# Patient Record
Sex: Female | Born: 1970
Health system: Southern US, Community
[De-identification: ages and names within clinical notes are randomized; demographics above are authoritative.]

## PROBLEM LIST (undated history)

## (undated) DIAGNOSIS — M1990 Unspecified osteoarthritis, unspecified site: Secondary | ICD-10-CM

## (undated) HISTORY — DX: Unspecified osteoarthritis, unspecified site: M19.90

---

## 1998-12-07 ENCOUNTER — Other Ambulatory Visit: Admission: RE | Admit: 1998-12-07 | Discharge: 1998-12-07 | Payer: Self-pay | Admitting: Obstetrics and Gynecology

## 2000-01-10 ENCOUNTER — Other Ambulatory Visit: Admission: RE | Admit: 2000-01-10 | Discharge: 2000-01-10 | Payer: Self-pay | Admitting: Obstetrics and Gynecology

## 2001-02-03 ENCOUNTER — Other Ambulatory Visit: Admission: RE | Admit: 2001-02-03 | Discharge: 2001-02-03 | Payer: Self-pay | Admitting: Obstetrics and Gynecology

## 2002-09-13 ENCOUNTER — Other Ambulatory Visit: Admission: RE | Admit: 2002-09-13 | Discharge: 2002-09-13 | Payer: Self-pay | Admitting: Obstetrics and Gynecology

## 2003-10-17 ENCOUNTER — Other Ambulatory Visit: Admission: RE | Admit: 2003-10-17 | Discharge: 2003-10-17 | Payer: Self-pay | Admitting: Obstetrics and Gynecology

## 2004-10-23 ENCOUNTER — Other Ambulatory Visit: Admission: RE | Admit: 2004-10-23 | Discharge: 2004-10-23 | Payer: Self-pay | Admitting: Obstetrics and Gynecology

## 2005-10-15 ENCOUNTER — Encounter (INDEPENDENT_AMBULATORY_CARE_PROVIDER_SITE_OTHER): Payer: Self-pay | Admitting: Specialist

## 2005-10-15 ENCOUNTER — Encounter: Admission: RE | Admit: 2005-10-15 | Discharge: 2005-10-15 | Payer: Self-pay | Admitting: Obstetrics and Gynecology

## 2008-01-26 ENCOUNTER — Ambulatory Visit: Payer: Self-pay | Admitting: Hematology and Oncology

## 2014-07-17 ENCOUNTER — Other Ambulatory Visit: Payer: Self-pay | Admitting: Obstetrics and Gynecology

## 2014-07-17 DIAGNOSIS — N6320 Unspecified lump in the left breast, unspecified quadrant: Secondary | ICD-10-CM

## 2014-07-19 ENCOUNTER — Other Ambulatory Visit: Payer: Self-pay | Admitting: Obstetrics and Gynecology

## 2014-07-19 ENCOUNTER — Other Ambulatory Visit: Payer: Self-pay

## 2014-07-19 DIAGNOSIS — R928 Other abnormal and inconclusive findings on diagnostic imaging of breast: Secondary | ICD-10-CM

## 2014-07-20 ENCOUNTER — Other Ambulatory Visit: Payer: Self-pay | Admitting: Obstetrics and Gynecology

## 2014-07-20 DIAGNOSIS — N6320 Unspecified lump in the left breast, unspecified quadrant: Secondary | ICD-10-CM

## 2014-07-24 ENCOUNTER — Ambulatory Visit
Admission: RE | Admit: 2014-07-24 | Discharge: 2014-07-24 | Disposition: A | Discharge status: Home / Self Care | Payer: BLUE CROSS/BLUE SHIELD | Source: Ambulatory Visit | Attending: Obstetrics and Gynecology | Admitting: Obstetrics and Gynecology

## 2014-07-24 DIAGNOSIS — N6320 Unspecified lump in the left breast, unspecified quadrant: Secondary | ICD-10-CM

## 2016-08-28 ENCOUNTER — Other Ambulatory Visit: Payer: Self-pay | Admitting: Obstetrics and Gynecology

## 2016-08-28 DIAGNOSIS — R928 Other abnormal and inconclusive findings on diagnostic imaging of breast: Secondary | ICD-10-CM

## 2016-09-03 ENCOUNTER — Ambulatory Visit
Admission: RE | Admit: 2016-09-03 | Discharge: 2016-09-03 | Disposition: A | Discharge status: Home / Self Care | Payer: BLUE CROSS/BLUE SHIELD | Source: Ambulatory Visit | Attending: Obstetrics and Gynecology | Admitting: Obstetrics and Gynecology

## 2016-09-03 DIAGNOSIS — R928 Other abnormal and inconclusive findings on diagnostic imaging of breast: Secondary | ICD-10-CM

## 2017-06-23 HISTORY — PX: POLYPECTOMY: SHX149

## 2017-08-25 ENCOUNTER — Other Ambulatory Visit: Payer: Self-pay | Admitting: Obstetrics and Gynecology

## 2017-08-25 DIAGNOSIS — N6310 Unspecified lump in the right breast, unspecified quadrant: Secondary | ICD-10-CM

## 2017-09-04 ENCOUNTER — Ambulatory Visit
Admission: RE | Admit: 2017-09-04 | Discharge: 2017-09-04 | Disposition: A | Discharge status: Home / Self Care | Payer: BLUE CROSS/BLUE SHIELD | Source: Ambulatory Visit | Attending: Obstetrics and Gynecology | Admitting: Obstetrics and Gynecology

## 2017-09-04 DIAGNOSIS — N6310 Unspecified lump in the right breast, unspecified quadrant: Secondary | ICD-10-CM

## 2018-02-22 IMAGING — MG 2D DIGITAL DIAGNOSTIC BILATERAL MAMMOGRAM WITH CAD AND ADJUNCT T
6 series · 6 of 14 positions shown · non-contrast
Comparison: Previous exam(s).

CLINICAL DATA: Patient presents for additional views of both
breasts as followup to a recent diagnostic exam to evaluate a
possible left breast asymmetry and right breast mass.

EXAM:
2D DIGITAL DIAGNOSTIC bilateral MAMMOGRAM WITH CAD AND ADJUNCT TOMO
ULTRASOUND right BREAST

[R MLO]
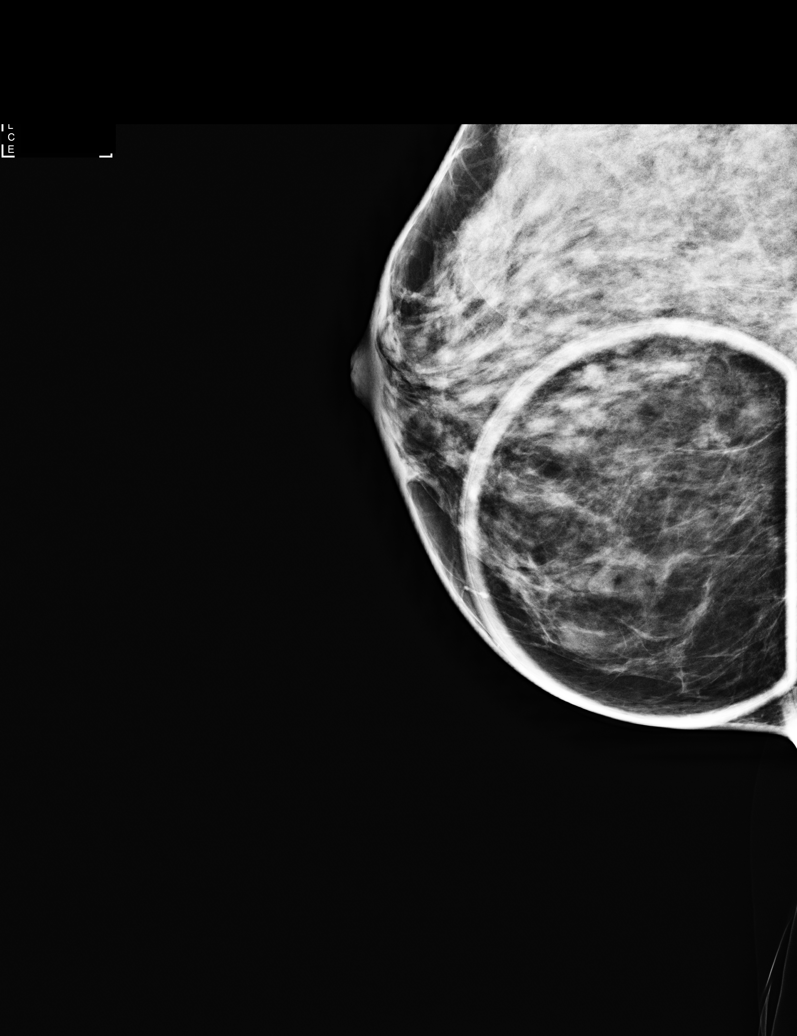

[L ML]
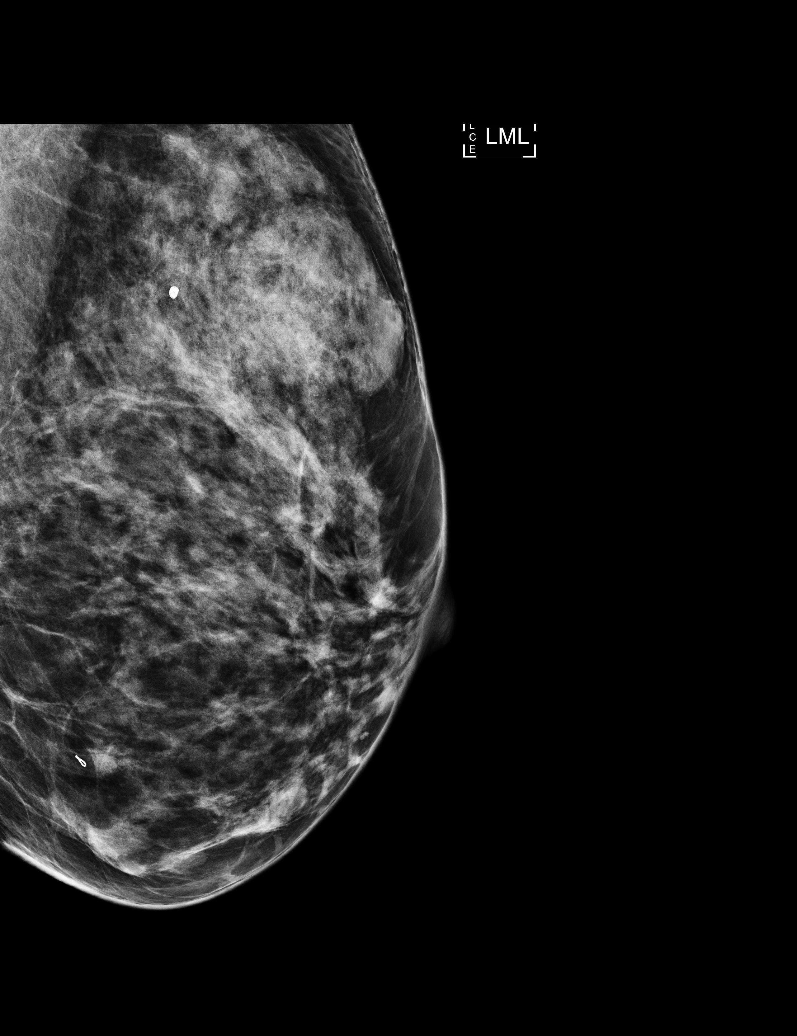

[L CC]
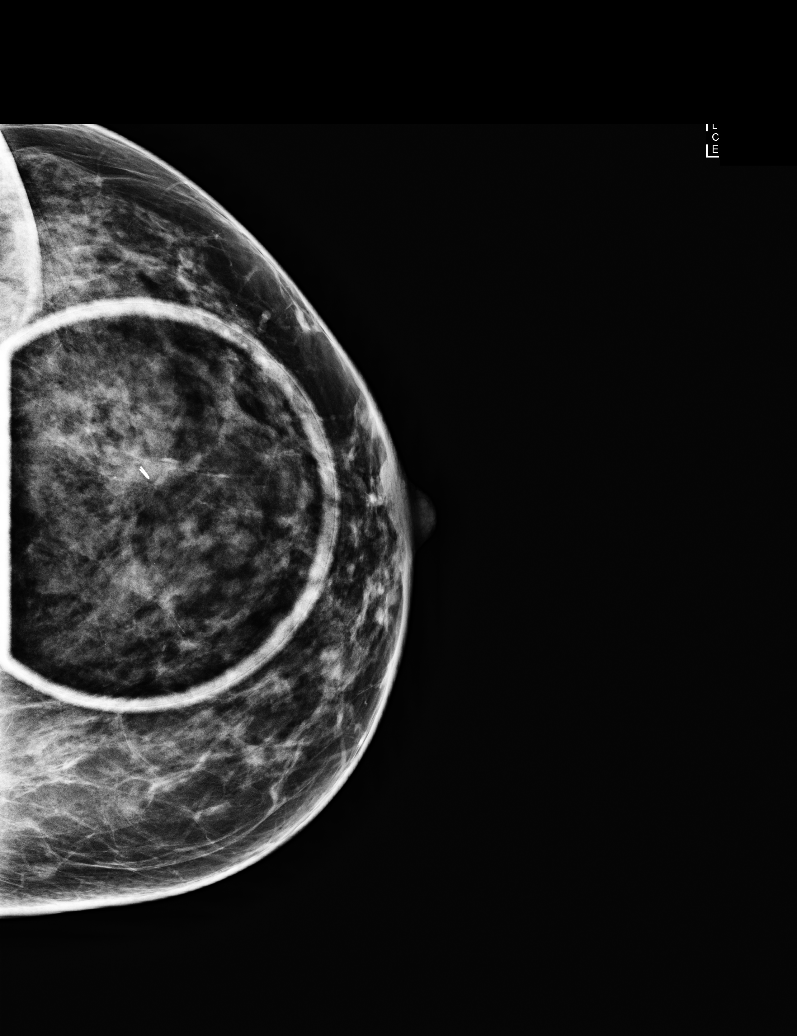

[R CC]
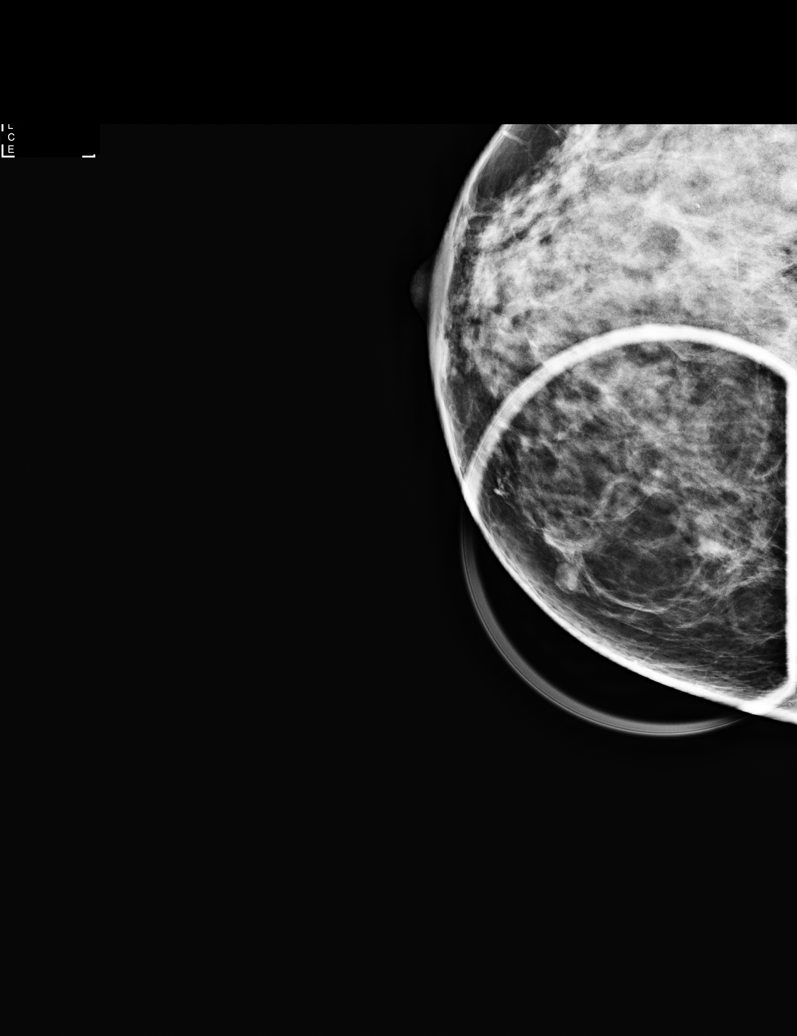

[R CC tomo · tomo slice 30/59.0]
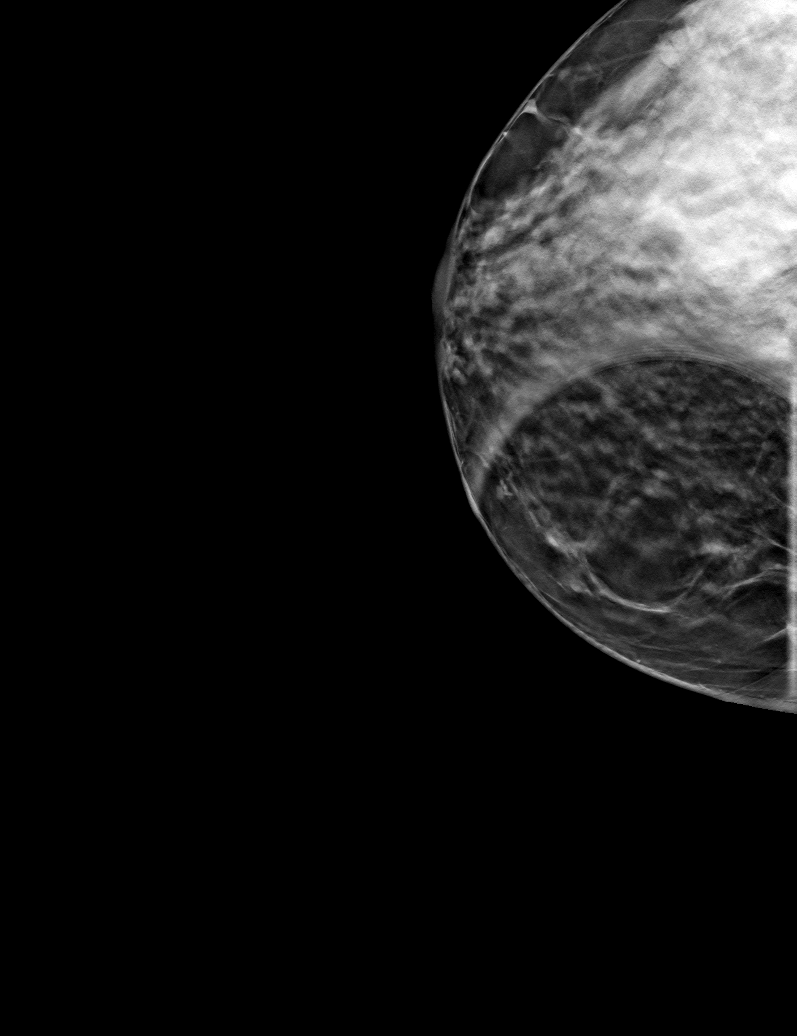

[R MLO tomo · tomo slice 28/55.0]
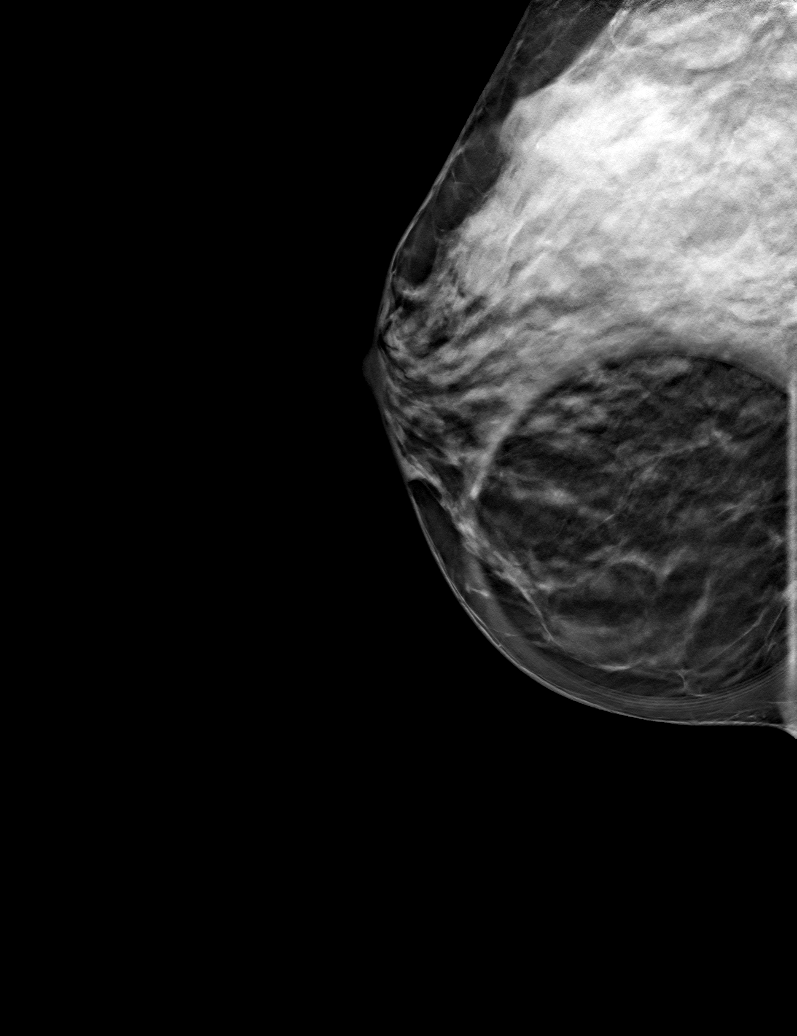

[6 of 14 positions shown; findings below may reference images not displayed]

ACR Breast Density Category c: The breast tissue is heterogeneously
dense, which may obscure small masses.
FINDINGS: Additional spot compression CC and true lateral tomographic images
of the left breast demonstrate no focal abnormality over the
slightly inner breast.

Additional images of the right breast demonstrate a 6 mm oval
circumscribed mass over the inner midportion of the right breast.

Mammographic images were processed with CAD.

Targeted ultrasound is performed, showing an oval circumscribed
hypoechoic superficial mass over the 3 o'clock position of the right
breast 3 cm from the nipple corresponding to the mammographic
abnormality. This has mild increased through transmission and a few
subtle thin septations. This likely represents a minimally
complicated cyst versus clustered microcysts and measures 4 x 5 x 6
mm.
IMPRESSION: No focal abnormality over the inner left breast.

Probable benign mass over the 3 o'clock position of the right breast
3 cm from the nipple measuring 4 x 5 x 6 mm.

RECOMMENDATION:
Recommend a followup diagnostic right breast mammogram and
ultrasound in 6 months to document stability of this probable benign
finding.

I have discussed the findings and recommendations with the patient.
Results were also provided in writing at the conclusion of the
visit. If applicable, a reminder letter will be sent to the patient
regarding the next appointment.

BI-RADS CATEGORY  3: Probably benign.

## 2019-02-23 IMAGING — US ULTRASOUND RIGHT BREAST LIMITED
1 series · 12 of 14 positions shown · non-contrast
Comparison: Mammography 09/03/2016, 07/24/2014 and earlier.

CLINICAL DATA: One year interval follow-up of likely benign complex
cyst or apocrine metaplasia involving the inner right breast at
anterior to middle depth. Annual evaluation, left breast.

EXAM:
DIGITAL DIAGNOSTIC BILATERAL MAMMOGRAM WITH CAD AND TOMO
ULTRASOUND RIGHT BREAST

[Series 1: ultrasound right breast limited · 0.05mm/px · 12 of 14 slices shown]
[im 1/14]
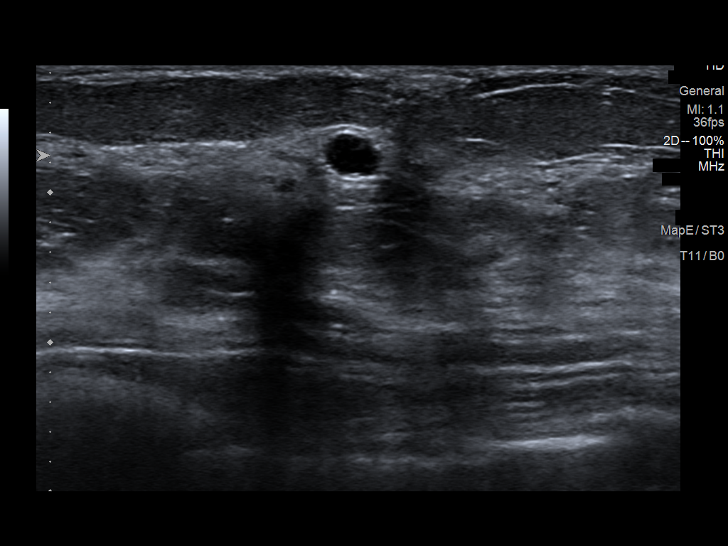
[im 2/14]
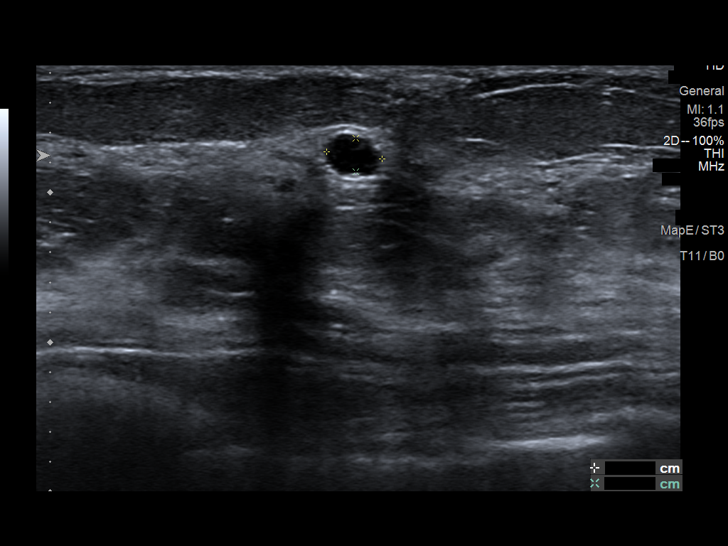
[im 3/14]
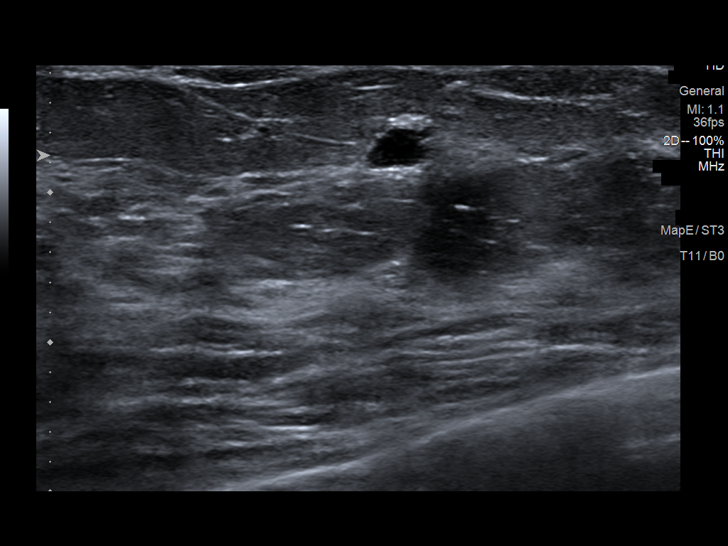
[im 5/14]
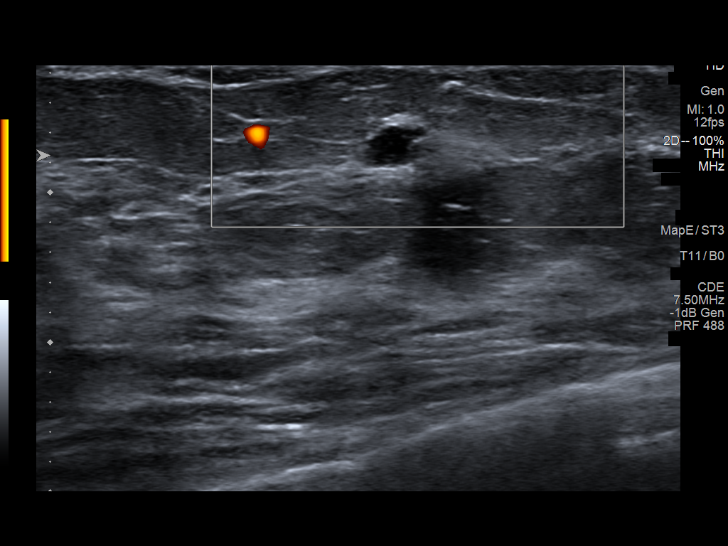
[im 6/14]
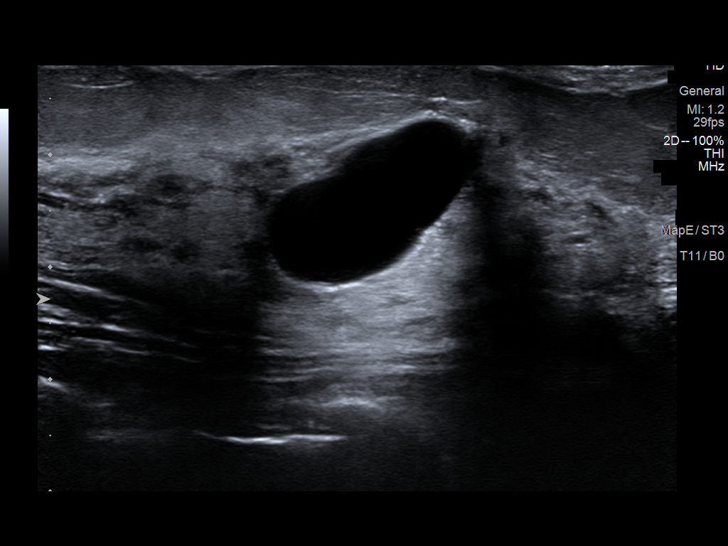
[im 7/14]
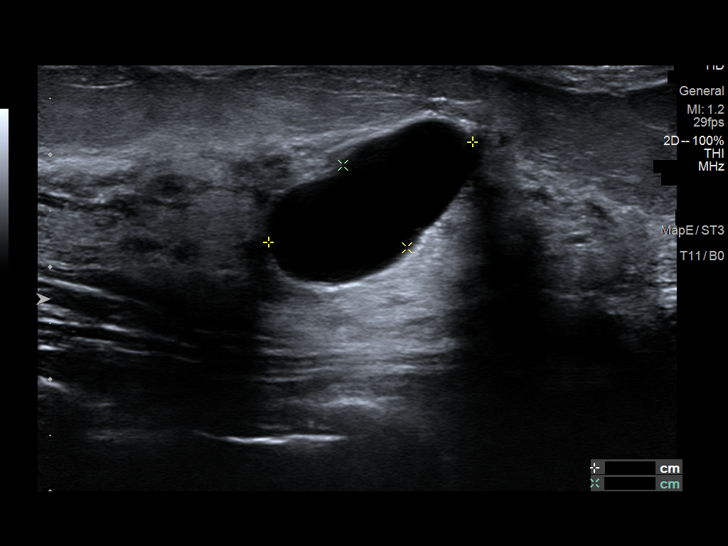
[im 8/14]
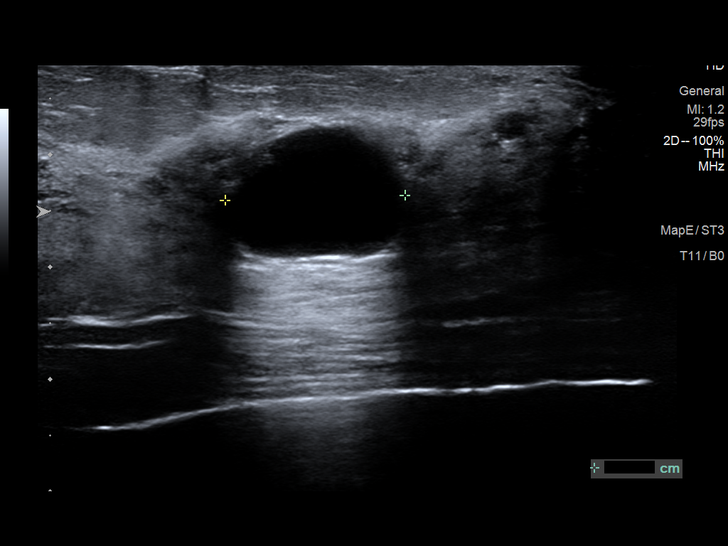
[im 9/14]
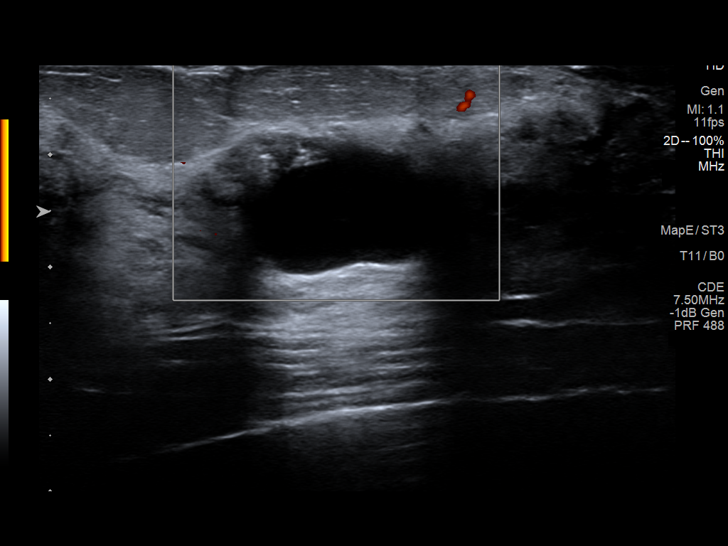
[im 10/14]
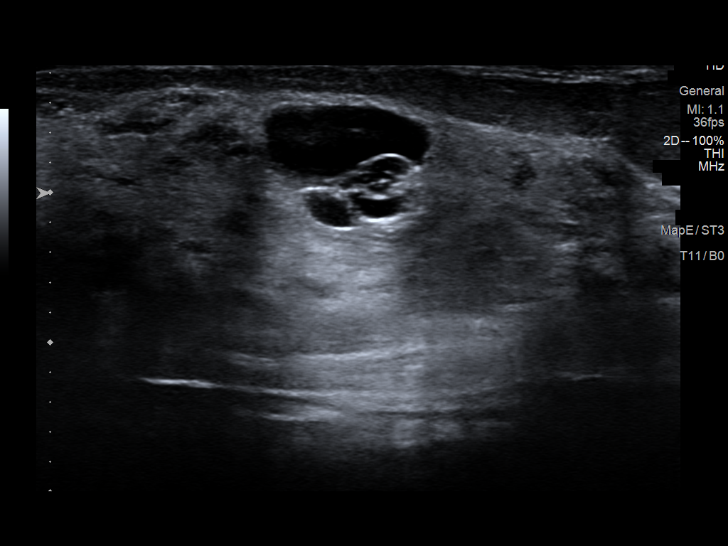
[im 12/14]
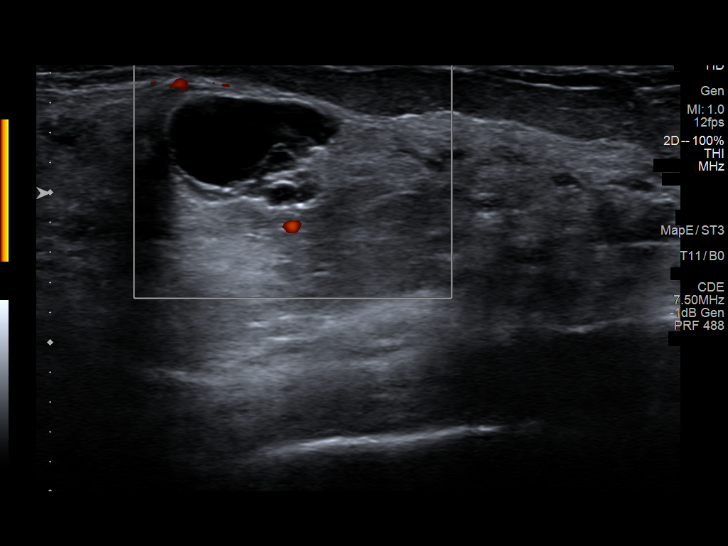
[im 13/14]
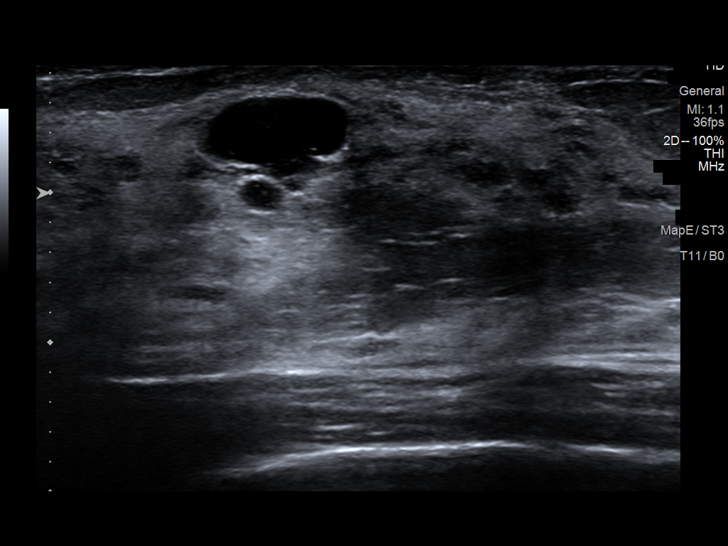
[im 14/14]
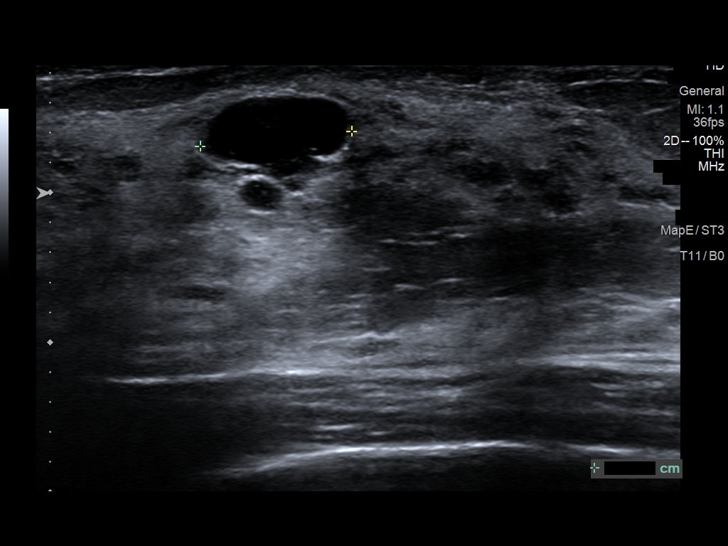

[12 of 14 positions shown; findings below may reference images not displayed]

Right
breast ultrasound 09/03/2016. Right breast ultrasound 07/24/2014 was
performed in a different part of the breast.

ACR Breast Density Category c: The breast tissue is heterogeneously
dense, which may obscure small masses.
FINDINGS: Tomosynthesis and synthesized full field CC and MLO views of both
breasts were obtained.

The circumscribed low-density mass in the inner right breast at
anterior to middle depth is slightly decreased in size since the
prior mammogram. In the interval, 2 circumscribed low-density masses
have increased in size in the outer right breast, 1 at anterior
depth and the other at posterior depth. None of the masses are
associated with architectural distortion or suspicious
calcifications. No suspicious findings elsewhere in the right
breast.

No findings suspicious for malignancy in the left breast.

Mammographic images were processed with CAD.

Targeted right breast ultrasound is performed, showing that the
previously identified circumscribed oval parallel nearly anechoic
mass at the 3 o'clock position approximately 3 cm from the nipple
has decreased in size, now measuring approximately 4 x 2 x 4 mm
(previously 5 x 4 x 6 mm).

At the 9 o'clock position approximately 1 cm from the nipple is a
circumscribed oval parallel anechoic mass measuring approximately
1.6 x 0.9 x 2.0 cm, demonstrating posterior acoustic enhancement and
no internal power Doppler flow, corresponding to the anterior
mammographic finding. At the 9 o'clock position approximately 5 cm
from the nipple is an oval circumscribed parallel mass containing
multiple cysts with associated thin internal septations measuring
approximately 1.0 x 0.8 x 1.0 cm, demonstrating posterior acoustic
enhancement and no internal power Doppler flow, corresponding to the
more posterior mammographic finding.

No suspicious solid mass or abnormal acoustic shadowing is
identified.
IMPRESSION: 1. Interval decrease in size of the previously identified complex
cyst in the inner right breast since the prior examinations 1 year
ago.
2. Benign cyst in the outer right breast at anterior depth and
benign clustered cysts/apocrine metaplasia/focal fibrocystic changes
in the outer right breast at posterior depth accounting for
mammographic findings.
3. No mammographic or sonographic evidence of malignancy involving
the right breast.
4. No mammographic evidence of malignancy involving the left breast.

RECOMMENDATION:
Screening mammogram in one year.(Code:[id number]-[id number])

I have discussed the findings and recommendations with the patient.
Results were also provided in writing at the conclusion of the
visit. If applicable, a reminder letter will be sent to the patient
regarding the next appointment.

BI-RADS CATEGORY  2: Benign.

## 2019-12-12 DIAGNOSIS — Z6823 Body mass index (BMI) 23.0-23.9, adult: Secondary | ICD-10-CM | POA: Diagnosis not present

## 2019-12-12 DIAGNOSIS — Z01419 Encounter for gynecological examination (general) (routine) without abnormal findings: Secondary | ICD-10-CM | POA: Diagnosis not present

## 2019-12-12 DIAGNOSIS — Z1231 Encounter for screening mammogram for malignant neoplasm of breast: Secondary | ICD-10-CM | POA: Diagnosis not present

## 2020-04-02 ENCOUNTER — Other Ambulatory Visit: Payer: Self-pay

## 2020-04-02 ENCOUNTER — Ambulatory Visit: Payer: BC Managed Care – PPO | Admitting: Family Medicine

## 2020-04-02 ENCOUNTER — Ambulatory Visit: Payer: Self-pay

## 2020-04-02 DIAGNOSIS — M25552 Pain in left hip: Secondary | ICD-10-CM | POA: Diagnosis not present

## 2020-04-02 DIAGNOSIS — M25561 Pain in right knee: Secondary | ICD-10-CM

## 2020-04-02 MED ORDER — CELECOXIB 200 MG PO CAPS: 200.0000 mg | ORAL_CAPSULE | Freq: Two times a day (BID) | ORAL | 6 refills | Status: DC | PRN

## 2020-04-02 NOTE — Patient Instructions (Addendum)
    Hip arthritis (moderate).  Mild right knee arthritis; possible medial meniscus injury.    Glucosamine Sulfate:  1,000 mg twice daily  Turmeric:  500 mg twice daily  Celebrex:  Take as needed.

## 2020-04-02 NOTE — Progress Notes (Signed)
Office Visit Note   Patient: Emily Dennis           Date of Birth: 09/26/1970           MRN: [id number] Visit Date: 04/02/2020 Requested by: No referring provider defined for this encounter. PCP: No primary care provider on file.  Subjective: Chief Complaint  Patient presents with  . Left Hip - Pain    Referred by Linton Rump. Sporadic pain in the hip - groin and lateral. Has lost mobility in that hip. With driving, had instances of pain shooting down the leg.  . Right Knee - Pain    Stiffness medial aspect of knee after sitting a while and starts to stand. Also, has a pain around the patella with flexing the knee beyond 90. Knee pops. Swells after standing on legs all day at work.    HPI: She is here with left hip and right knee pain.  She is seen at the request of Dr. Vilinda Blanks.  Her hip has been intermittently bothersome for about a year, no injury.  She notices stiffness at first and then occasional pain.  Her knee has bothered her in the past few months with a sharper pain when she forcefully flexes the knee, a popping sensation in the front of her knee, and occasional swelling after standing for long periods of time.  The knee has not locked.  She has a family history of arthritis.              ROS:   All other systems were reviewed and are negative.  Objective: Vital Signs: There were no vitals taken for this visit.  Physical Exam:  General:  Alert and oriented, in no acute distress. Pulm:  Breathing unlabored. Psy:  Normal mood, congruent affect.  Left hip: She has very limited range of motion with internal rotation of barely 20 degrees, external rotation is still fairly good at about 60 degrees.  The normal right hip range of motion is 45 degrees internal and 75 degrees external rotation.  There is good range of motion with flexion, no significant tenderness over the greater trochanter. Right knee: Palpable patellofemoral click, does not hurt.  No pain with patella  compression.  Trace effusion with no warmth.  Lachman's feels solid, no laxity with varus or valgus stress.  Tenderness on the medial joint line, pain but no palpable click McMurray's.  Imaging: XR HIP UNILAT W OR W/O PELVIS 2-3 VIEWS LEFT  Result Date: 04/02/2020 X-rays of the left hip reveal moderate joint space narrowing and spurring of the femoral head.  No sign of AVN or stress fracture.  XR KNEE 3 VIEW RIGHT  Result Date: 04/02/2020 X-rays of the right knee reveal very early spurring in the medial compartment with mild joint space narrowing.  No sign of loose body or stress fracture.  Patellofemoral joint looks good.   Assessment & Plan: 1.  Left hip pain most likely due to DJD -Glucosamine, turmeric, Celebrex as needed.  Leg raises for strengthening and range of motion. -If pain worsens, could contemplate injection therapy such as dextrose prolotherapy or PRP, or a one-time cortisone injection.  2.  Right knee pain, question medial meniscus injury.  She does have early arthritis. -Treatment as above.  One-time injection or MRI scan if symptoms worsen.     Procedures: No procedures performed  No notes on file     PMFS History: There are no problems to display for this patient.  No past medical  history on file.  Family History  Problem Relation Age of Onset  . Breast cancer Paternal Grandmother        late 85's to early 70's    No past surgical history on file. Social History   Occupational History  . Not on file  Tobacco Use  . Smoking status: Not on file  Substance and Sexual Activity  . Alcohol use: Not on file  . Drug use: Not on file  . Sexual activity: Not on file

## 2020-11-30 ENCOUNTER — Encounter: Payer: Self-pay | Admitting: Family Medicine

## 2020-11-30 ENCOUNTER — Ambulatory Visit: Payer: BC Managed Care – PPO | Admitting: Family Medicine

## 2020-11-30 VITALS — BP 122/62 | HR 78 | Ht 66.0 in | Wt 140.6 lb

## 2020-11-30 DIAGNOSIS — H9313 Tinnitus, bilateral: Secondary | ICD-10-CM | POA: Diagnosis not present

## 2020-11-30 DIAGNOSIS — Z1329 Encounter for screening for other suspected endocrine disorder: Secondary | ICD-10-CM

## 2020-11-30 DIAGNOSIS — Z13220 Encounter for screening for lipoid disorders: Secondary | ICD-10-CM

## 2020-11-30 DIAGNOSIS — Z1211 Encounter for screening for malignant neoplasm of colon: Secondary | ICD-10-CM

## 2020-11-30 DIAGNOSIS — Z1159 Encounter for screening for other viral diseases: Secondary | ICD-10-CM

## 2020-11-30 DIAGNOSIS — Z0000 Encounter for general adult medical examination without abnormal findings: Secondary | ICD-10-CM | POA: Diagnosis not present

## 2020-11-30 NOTE — Progress Notes (Signed)
Subjective:    Patient ID: Emily Dennis, female    DOB: 24-Apr-1971, 50 y.o.   MRN: [id number]  HPI Chief Complaint  Patient presents with   new pt get established    New pt get etablished and CPE. Sees obgyn- with [facility].    She is new to the practice and here for a complete physical exam. Previous medical care: Last CPE:   Other providers: OB/GYN - Dr. Gertie Fey  Dr. Junius Roads at [facility]  ENT in past    Hot flashes, night sweats and periods are irregular for the past year. Sees her OB/GYN soon.  Hx of tinnitus in both ears with a whooshing sound in her left ear when bending over.    Social history: Lives married, no kids, works at [employer] as a Training and development officer  Denies smoking, drinking alcohol, drug use  Diet: fairly healthy. No pork or red meat. Used to be vegetarian.  Excerise: 4-5 days per week   Immunizations: Covid vaccines -3  Does not get flu shots.   Health maintenance:   Mammogram: last year  Colonoscopy: never  Last Gynecological Exam: last year  Last Menstrual cycle: March 2021  Last Dental Exam: Dr. Toy Cookey  Last Eye Exam: 5 years ago. Readers for now   Wears seatbelt always, uses sunscreen, smoke detectors in home and functioning, does not text while driving and feels safe in home environment.   Reviewed allergies, medications, past medical, surgical, family, and social history.    Review of Systems Review of Systems Constitutional: -fever, -chills, -sweats, -unexpected weight change,-fatigue ENT: -runny nose, -ear pain, -sore throat Cardiology:  -chest pain, -palpitations, -edema Respiratory: -cough, -shortness of breath, -wheezing Gastroenterology: -abdominal pain, -nausea, -vomiting, -diarrhea, -constipation  Hematology: -bleeding or bruising problems Musculoskeletal: -arthralgias, -myalgias, -joint swelling, -back pain Ophthalmology: -vision changes Urology: -dysuria, -difficulty urinating, -hematuria, -urinary frequency,  -urgency Neurology: -headache, -weakness, -tingling, -numbness       Objective:   Physical Exam BP 122/62   Pulse 78   Ht 5\' 6"  (1.676 m)   Wt 140 lb 9.6 oz (63.8 kg)   LMP 09/15/2020   BMI 22.69 kg/m   General Appearance:    Alert, cooperative, no distress, appears stated age  Head:    Normocephalic, without obvious abnormality, atraumatic  Eyes:    PERRL, conjunctiva/corneas clear, EOM's intact  Ears:    Normal TM's and external ear canals  Nose:   Mask on  Throat:   Mask on   Neck:   Supple, no lymphadenopathy;  thyroid:  no   enlargement/tenderness/nodules; no carotid   bruit or JVD  Back:    Spine nontender, no curvature, ROM normal, no CVA     tenderness  Lungs:     Clear to auscultation bilaterally without wheezes, rales or     ronchi; respirations unlabored  Chest Wall:    No tenderness or deformity   Heart:    Regular rate and rhythm, S1 and S2 normal, no murmur, rub   or gallop  Breast Exam:    OB/GYN  Abdomen:     Soft, non-tender, nondistended, normoactive bowel sounds,    no masses, no hepatosplenomegaly  Genitalia:    OB/GYN     Extremities:   No clubbing, cyanosis or edema  Pulses:   2+ and symmetric all extremities  Skin:   Skin color, texture, turgor normal, no rashes or lesions  Lymph nodes:   Cervical, supraclavicular, and axillary nodes normal  Neurologic:  CNII-XII intact, normal strength, sensation and gait          Psych:   Normal mood, affect, hygiene and grooming.         Assessment & Plan:   Routine general medical examination at a health care facility - Plan: CBC with Differential/Platelet, Comprehensive metabolic panel, TSH, T4, free, Lipid panel -She is a pleasant 50 year old female who is new to the practice here today for a CPE.  She is not fasting and will return for labs next week.  Preventive health care reviewed.  She sees an OB/GYN.  She is in good spirits and in her usual state of health.  Counseling on healthy lifestyle including  diet and exercise.  Recommend regular dental and eye exams.  Immunizations reviewed.  She declines any vaccines today.  She may return for Tdap at her convenience.  We also discussed shingles vaccine.  Up-to-date on COVID vaccines.  Discussed safety and health promotion.  Screen for colon cancer - Plan: Ambulatory referral to Gastroenterology -Per screening guidelines  Tinnitus of both ears -Follow-up with ENT if this is worsening.  Screening for thyroid disorder - Plan: TSH, T4, free  Screening for lipid disorders - Plan: Lipid panel  Need for hepatitis C screening test - Plan: Hepatitis C antibody -Done per screening guidelines

## 2020-11-30 NOTE — Patient Instructions (Signed)
Schedule a fasting lab visit in the next week    Preventive Care 3-50 Years Old, Female Preventive care refers to lifestyle choices and visits with your health care provider that can promote health and wellness. This includes: A yearly physical exam. This is also called an annual wellness visit. Regular dental and eye exams. Immunizations. Screening for certain conditions. Healthy lifestyle choices, such as: Eating a healthy diet. Getting regular exercise. Not using drugs or products that contain nicotine and tobacco. Limiting alcohol use. What can I expect for my preventive care visit? Physical exam Your health care provider will check your: Height and weight. These may be used to calculate your BMI (body mass index). BMI is a measurement that tells if you are at a healthy weight. Heart rate and blood pressure. Body temperature. Skin for abnormal spots. Counseling Your health care provider may ask you questions about your: Past medical problems. Family's medical history. Alcohol, tobacco, and drug use. Emotional well-being. Home life and relationship well-being. Sexual activity. Diet, exercise, and sleep habits. Work and work Statistician. Access to firearms. Method of birth control. Menstrual cycle. Pregnancy history. What immunizations do I need?  Vaccines are usually given at various ages, according to a schedule. Your health care provider will recommend vaccines for you based on your age, medicalhistory, and lifestyle or other factors, such as travel or where you work. What tests do I need? Blood tests Lipid and cholesterol levels. These may be checked every 5 years, or more often if you are over 47 years old. Hepatitis C test. Hepatitis B test. Screening Lung cancer screening. You may have this screening every year starting at age 83 if you have a 30-pack-year history of smoking and currently smoke or have quit within the past 15 years. Colorectal cancer  screening. All adults should have this screening starting at age 71 and continuing until age 80. Your health care provider may recommend screening at age 11 if you are at increased risk. You will have tests every 1-10 years, depending on your results and the type of screening test. Diabetes screening. This is done by checking your blood sugar (glucose) after you have not eaten for a while (fasting). You may have this done every 1-3 years. Mammogram. This may be done every 1-2 years. Talk with your health care provider about when you should start having regular mammograms. This may depend on whether you have a family history of breast cancer. BRCA-related cancer screening. This may be done if you have a family history of breast, ovarian, tubal, or peritoneal cancers. Pelvic exam and Pap test. This may be done every 3 years starting at age 65. Starting at age 56, this may be done every 5 years if you have a Pap test in combination with an HPV test. Other tests STD (sexually transmitted disease) testing, if you are at risk. Bone density scan. This is done to screen for osteoporosis. You may have this scan if you are at high risk for osteoporosis. Talk with your health care provider about your test results, treatment options,and if necessary, the need for more tests. Follow these instructions at home: Eating and drinking  Eat a diet that includes fresh fruits and vegetables, whole grains, lean protein, and low-fat dairy products. Take vitamin and mineral supplements as recommended by your health care provider. Do not drink alcohol if: Your health care provider tells you not to drink. You are pregnant, may be pregnant, or are planning to become pregnant. If you drink alcohol:  Limit how much you have to 0-1 drink a day. Be aware of how much alcohol is in your drink. In the [place] one drink equals one 12 oz bottle of beer (355 mL), one 5 oz glass of wine (148 mL), or one 1 oz glass of hard  liquor (44 mL).  Lifestyle Take daily care of your teeth and gums. Brush your teeth every morning and night with fluoride toothpaste. Floss one time each day. Stay active. Exercise for at least 30 minutes 5 or more days each week. Do not use any products that contain nicotine or tobacco, such as cigarettes, e-cigarettes, and chewing tobacco. If you need help quitting, ask your health care provider. Do not use drugs. If you are sexually active, practice safe sex. Use a condom or other form of protection to prevent STIs (sexually transmitted infections). If you do not wish to become pregnant, use a form of birth control. If you plan to become pregnant, see your health care provider for a prepregnancy visit. If told by your health care provider, take low-dose aspirin daily starting at age 11. Find healthy ways to cope with stress, such as: Meditation, yoga, or listening to music. Journaling. Talking to a trusted person. Spending time with friends and family. Safety Always wear your seat belt while driving or riding in a vehicle. Do not drive: If you have been drinking alcohol. Do not ride with someone who has been drinking. When you are tired or distracted. While texting. Wear a helmet and other protective equipment during sports activities. If you have firearms in your house, make sure you follow all gun safety procedures. What's next? Visit your health care provider once a year for an annual wellness visit. Ask your health care provider how often you should have your eyes and teeth checked. Stay up to date on all vaccines. This information is not intended to replace advice given to you by your health care provider. Make sure you discuss any questions you have with your healthcare provider. Document Revised: 03/13/2020 Document Reviewed: 02/18/2018 Elsevier Patient Education  2022 [employer].

## 2020-12-03 ENCOUNTER — Other Ambulatory Visit: Payer: BC Managed Care – PPO

## 2020-12-03 DIAGNOSIS — Z13220 Encounter for screening for lipoid disorders: Secondary | ICD-10-CM | POA: Diagnosis not present

## 2020-12-03 DIAGNOSIS — Z1159 Encounter for screening for other viral diseases: Secondary | ICD-10-CM | POA: Diagnosis not present

## 2020-12-03 DIAGNOSIS — Z1329 Encounter for screening for other suspected endocrine disorder: Secondary | ICD-10-CM | POA: Diagnosis not present

## 2020-12-03 DIAGNOSIS — Z0000 Encounter for general adult medical examination without abnormal findings: Secondary | ICD-10-CM | POA: Diagnosis not present

## 2020-12-05 LAB — CBC WITH DIFFERENTIAL/PLATELET
Basophils Absolute: 0.1 10*3/uL (ref 0.0–0.2)
Basos: 1 %
EOS (ABSOLUTE): 0.2 10*3/uL (ref 0.0–0.4)
Eos: 4 %
Hematocrit: 38.4 % (ref 34.0–46.6)
Hemoglobin: 12.7 g/dL (ref 11.1–15.9)
Immature Grans (Abs): 0 10*3/uL (ref 0.0–0.1)
Immature Granulocytes: 0 %
Lymphocytes Absolute: 2.1 10*3/uL (ref 0.7–3.1)
Lymphs: 42 %
MCH: 32.2 pg (ref 26.6–33.0)
MCHC: 33.1 g/dL (ref 31.5–35.7)
MCV: 97 fL (ref 79–97)
Monocytes Absolute: 0.4 10*3/uL (ref 0.1–0.9)
Monocytes: 7 %
Neutrophils Absolute: 2.3 10*3/uL (ref 1.4–7.0)
Neutrophils: 46 %
Platelets: 307 10*3/uL (ref 150–450)
RBC: 3.95 x10E6/uL (ref 3.77–5.28)
RDW: 12.4 % (ref 11.7–15.4)
WBC: 5.1 10*3/uL (ref 3.4–10.8)

## 2020-12-05 LAB — HEPATITIS C ANTIBODY: Hep C Virus Ab: 0.2 s/co ratio (ref 0.0–0.9)

## 2020-12-05 LAB — COMPREHENSIVE METABOLIC PANEL
ALT: 8 IU/L (ref 0–32)
AST: 14 IU/L (ref 0–40)
Albumin/Globulin Ratio: 1.7 (ref 1.2–2.2)
Albumin: 4.3 g/dL (ref 3.8–4.8)
Alkaline Phosphatase: 63 IU/L (ref 44–121)
BUN/Creatinine Ratio: 17 (ref 9–23)
BUN: 11 mg/dL (ref 6–24)
Bilirubin Total: 0.9 mg/dL (ref 0.0–1.2)
CO2: 22 mmol/L (ref 20–29)
Calcium: 9.2 mg/dL (ref 8.7–10.2)
Chloride: 104 mmol/L (ref 96–106)
Creatinine, Ser: 0.64 mg/dL (ref 0.57–1.00)
Globulin, Total: 2.5 g/dL (ref 1.5–4.5)
Glucose: 96 mg/dL (ref 65–99)
Potassium: 4.3 mmol/L (ref 3.5–5.2)
Sodium: 140 mmol/L (ref 134–144)
Total Protein: 6.8 g/dL (ref 6.0–8.5)
eGFR: 108 mL/min/{1.73_m2} (ref >59)

## 2020-12-05 LAB — LIPID PANEL
Chol/HDL Ratio: 2.1 ratio (ref 0.0–4.4)
Cholesterol, Total: 171 mg/dL (ref 100–199)
HDL: 83 mg/dL (ref >39)
LDL Chol Calc (NIH): 77 mg/dL (ref 0–99)
Triglycerides: 54 mg/dL (ref 0–149)
VLDL Cholesterol Cal: 11 mg/dL (ref 5–40)

## 2020-12-05 LAB — T4, FREE: Free T4: 1.13 ng/dL (ref 0.82–1.77)

## 2020-12-05 LAB — TSH: TSH: 1.82 u[IU]/mL (ref 0.450–4.500)

## 2020-12-05 NOTE — Progress Notes (Signed)
Her labs are all normal. Great news.

## 2020-12-13 DIAGNOSIS — Z01419 Encounter for gynecological examination (general) (routine) without abnormal findings: Secondary | ICD-10-CM | POA: Diagnosis not present

## 2020-12-13 DIAGNOSIS — Z6822 Body mass index (BMI) 22.0-22.9, adult: Secondary | ICD-10-CM | POA: Diagnosis not present

## 2020-12-13 DIAGNOSIS — Z1231 Encounter for screening mammogram for malignant neoplasm of breast: Secondary | ICD-10-CM | POA: Diagnosis not present

## 2021-08-14 DIAGNOSIS — H524 Presbyopia: Secondary | ICD-10-CM | POA: Diagnosis not present

## 2022-03-05 DIAGNOSIS — Z01419 Encounter for gynecological examination (general) (routine) without abnormal findings: Secondary | ICD-10-CM | POA: Diagnosis not present

## 2022-03-05 DIAGNOSIS — Z6823 Body mass index (BMI) 23.0-23.9, adult: Secondary | ICD-10-CM | POA: Diagnosis not present

## 2022-03-05 DIAGNOSIS — Z1231 Encounter for screening mammogram for malignant neoplasm of breast: Secondary | ICD-10-CM | POA: Diagnosis not present

## 2022-03-10 ENCOUNTER — Encounter: Payer: Self-pay | Admitting: Gastroenterology

## 2022-03-26 ENCOUNTER — Ambulatory Visit (AMBULATORY_SURGERY_CENTER): Payer: Self-pay

## 2022-03-26 VITALS — Ht 66.0 in | Wt 141.0 lb

## 2022-03-26 DIAGNOSIS — Z1211 Encounter for screening for malignant neoplasm of colon: Secondary | ICD-10-CM

## 2022-03-26 MED ORDER — NA SULFATE-K SULFATE-MG SULF 17.5-3.13-1.6 GM/177ML PO SOLN: 1.0000 | Freq: Once | ORAL | 0 refills | Status: AC

## 2022-03-26 NOTE — Progress Notes (Signed)
No egg or soy allergy known to patient  No issues known to pt with past sedation with any surgeries or procedures Patient denies ever being told they had issues or difficulty with intubation  No FH of Malignant Hyperthermia Pt is not on diet pills Pt is not on  home 02  Pt is not on blood thinners  Pt denies issues with constipation  No A fib or A flutter Have any cardiac testing pending--no Pt instructed to use Singlecare.com or GoodRx for a price reduction on prep   

## 2022-04-07 ENCOUNTER — Encounter: Payer: Self-pay | Admitting: Gastroenterology

## 2022-04-12 ENCOUNTER — Encounter: Payer: Self-pay | Admitting: Certified Registered Nurse Anesthetist

## 2022-04-16 ENCOUNTER — Ambulatory Visit (AMBULATORY_SURGERY_CENTER): Payer: BC Managed Care – PPO | Admitting: Gastroenterology

## 2022-04-16 ENCOUNTER — Encounter: Payer: Self-pay | Admitting: Gastroenterology

## 2022-04-16 VITALS — BP 111/53 | HR 64 | Temp 98.2°F | Resp 9 | Ht 66.0 in | Wt 141.0 lb

## 2022-04-16 DIAGNOSIS — K635 Polyp of colon: Secondary | ICD-10-CM

## 2022-04-16 DIAGNOSIS — Z1211 Encounter for screening for malignant neoplasm of colon: Secondary | ICD-10-CM | POA: Diagnosis not present

## 2022-04-16 DIAGNOSIS — D122 Benign neoplasm of ascending colon: Secondary | ICD-10-CM

## 2022-04-16 DIAGNOSIS — D120 Benign neoplasm of cecum: Secondary | ICD-10-CM

## 2022-04-16 MED ORDER — SODIUM CHLORIDE 0.9 % IV SOLN: 500.0000 mL | Freq: Once | INTRAVENOUS | Status: DC

## 2022-04-16 NOTE — Progress Notes (Signed)
[facility] History and Physical   Primary Care Physician:  Girtha Rm, NP-C   Reason for Procedure:   Colon cancer screening  Plan:    colonoscopy     HPI: Emily Dennis is a 51 y.o. female  here for colonoscopy screening - first time exam. Patient denies any bowel symptoms at this time. No family history of colon cancer known. Otherwise feels well without any cardiopulmonary symptoms.   I have discussed risks / benefits of anesthesia and endoscopic procedure with Emily Dennis and they wish to proceed with the exams as outlined today.    Past Medical History:  Diagnosis Date   Arthritis     Past Surgical History:  Procedure Laterality Date   POLYPECTOMY  2019   Uterine    Prior to Admission medications   Medication Sig Start Date End Date Taking? Authorizing Provider  Multiple Vitamin (MULTIVITAMIN) tablet Take 1 tablet by mouth daily.   Yes [provider]    Current Outpatient Medications  Medication Sig Dispense Refill   Multiple Vitamin (MULTIVITAMIN) tablet Take 1 tablet by mouth daily.     Current Facility-Administered Medications  Medication Dose Route Frequency Provider Last Rate Last Admin   0.9 %  sodium chloride infusion  500 mL Intravenous Once Arhianna Ebey, Carlota Raspberry, MD        Allergies as of 04/16/2022   (No Known Allergies)    Family History  Problem Relation Age of Onset   Breast cancer Paternal Grandmother        late 59's to early 70's   Colon cancer Neg Hx    Colon polyps Neg Hx    Esophageal cancer Neg Hx    Rectal cancer Neg Hx    Stomach cancer Neg Hx     Social History   Socioeconomic History   Marital status: Married    Spouse name: Not on file   Number of children: Not on file   Years of education: Not on file   Highest education level: Not on file  Occupational History   Not on file  Tobacco Use   Smoking status: Never   Smokeless tobacco: Never  Vaping Use   Vaping Use: Never used  Substance  and Sexual Activity   Alcohol use: Yes    Comment: ocassionally   Drug use: Never   Sexual activity: Yes  Other Topics Concern   Not on file  Social History Narrative   Not on file   Social Determinants of Health   Financial Resource Strain: Not on file  Food Insecurity: Not on file  Transportation Needs: Not on file  Physical Activity: Not on file  Stress: Not on file  Social Connections: Not on file  Intimate Partner Violence: Not on file    Review of Systems: All other review of systems negative except as mentioned in the HPI.  Physical Exam: Vital signs BP 97/64 (BP Location: Right Arm, Patient Position: Sitting, Cuff Size: Normal)   Pulse 77   Temp 98.2 F (36.8 C) (Temporal)   Ht '5\' 6"'$  (1.676 m)   Wt 141 lb (64 kg)   LMP 12/05/2021   SpO2 100%   BMI 22.76 kg/m   General:   Alert,  Well-developed, pleasant and cooperative in NAD Lungs:  Clear throughout to auscultation.   Heart:  Regular rate and rhythm Abdomen:  Soft, nontender and nondistended.   Neuro/Psych:  Alert and cooperative. Normal mood and affect. A and O x 3  Emily Mango,  MD [facility]

## 2022-04-16 NOTE — Op Note (Signed)
[facility] Patient Name: Harly Pipkins Procedure Date: 04/16/2022 11:14 AM MRN: [id number] Endoscopist: Remo Lipps P. Havery Moros , MD, [id number] Age: 51 Referring MD:  Date of Birth: [birth year] Gender: Female Account #: [id number] Procedure:                Colonoscopy Indications:              Screening for colorectal malignant neoplasm, This                            is the patient's first colonoscopy Medicines:                Monitored Anesthesia Care Procedure:                Pre-Anesthesia Assessment:                           - Prior to the procedure, a History and Physical                            was performed, and patient medications and                            allergies were reviewed. The patient's tolerance of                            previous anesthesia was also reviewed. The risks                            and benefits of the procedure and the sedation                            options and risks were discussed with the patient.                            All questions were answered, and informed consent                            was obtained. Prior Anticoagulants: The patient has                            taken no anticoagulant or antiplatelet agents. ASA                            Grade Assessment: II - A patient with mild systemic                            disease. After reviewing the risks and benefits,                            the patient was deemed in satisfactory condition to                            undergo the procedure.  After obtaining informed consent, the colonoscope                            was passed under direct vision. Throughout the                            procedure, the patient's blood pressure, pulse, and                            oxygen saturations were monitored continuously. The                            PCF-HQ190L Colonoscope was introduced through the                            anus and  advanced to the the cecum, identified by                            appendiceal orifice and ileocecal valve. The                            colonoscopy was performed without difficulty. The                            patient tolerated the procedure well. The quality                            of the bowel preparation was good. The ileocecal                            valve, appendiceal orifice, and rectum were                            photographed. Scope In: 11:22:25 AM Scope Out: 11:40:02 AM Scope Withdrawal Time: 0 hours 15 minutes 5 seconds  Total Procedure Duration: 0 hours 17 minutes 37 seconds  Findings:                 The perianal and digital rectal examinations were                            normal.                           Two flat and sessile polyps were found in the                            ascending colon and cecum. The polyps were 5 mm in                            size. These polyps were removed with a cold snare.                            Resection and retrieval were complete.  Internal hemorrhoids were found during                            retroflexion. The hemorrhoids were small.                           The exam was otherwise without abnormality. Complications:            No immediate complications. Estimated blood loss:                            Minimal. Estimated Blood Loss:     Estimated blood loss was minimal. Impression:               - Two 5 mm polyps in the ascending colon and in the                            cecum, removed with a cold snare. Resected and                            retrieved.                           - Internal hemorrhoids.                           - The examination was otherwise normal. Recommendation:           - Patient has a contact number available for                            emergencies. The signs and symptoms of potential                            delayed complications were discussed with the                             patient. Return to normal activities tomorrow.                            Written discharge instructions were provided to the                            patient.                           - Resume previous diet.                           - Continue present medications.                           - Await pathology results. Remo Lipps P. Claude Swendsen, MD 04/16/2022 11:44:00 AM This report has been signed electronically.

## 2022-04-16 NOTE — Progress Notes (Signed)
Vitals-CW  Pt's states no medical or surgical changes since previsit or office visit. 

## 2022-04-16 NOTE — Progress Notes (Signed)
Report given to PACU, vss 

## 2022-04-16 NOTE — Patient Instructions (Signed)
YOU HAD AN ENDOSCOPIC PROCEDURE TODAY AT THE [facility]:   Refer to the procedure report that was given to you for any specific questions about what was found during the examination.  If the procedure report does not answer your questions, please call your gastroenterologist to clarify.  If you requested that your care partner not be given the details of your procedure findings, then the procedure report has been included in a sealed envelope for you to review at your convenience later.  **Handouts given on polyps and hemorrhoids**  YOU SHOULD EXPECT: Some feelings of bloating in the abdomen. Passage of more gas than usual.  Walking can help get rid of the air that was put into your GI tract during the procedure and reduce the bloating. If you had a lower endoscopy (such as a colonoscopy or flexible sigmoidoscopy) you may notice spotting of blood in your stool or on the toilet paper. If you underwent a bowel prep for your procedure, you may not have a normal bowel movement for a few days.  Please Note:  You might notice some irritation and congestion in your nose or some drainage.  This is from the oxygen used during your procedure.  There is no need for concern and it should clear up in a day or so.  SYMPTOMS TO REPORT IMMEDIATELY:  Following lower endoscopy (colonoscopy or flexible sigmoidoscopy):  Excessive amounts of blood in the stool  Significant tenderness or worsening of abdominal pains  Swelling of the abdomen that is new, acute  Fever of 100F or higher  For urgent or emergent issues, a gastroenterologist can be reached at any hour by calling [phone]. Do not use MyChart messaging for urgent concerns.    DIET:  We do recommend a small meal at first, but then you may proceed to your regular diet.  Drink plenty of fluids but you should avoid alcoholic beverages for 24 hours.  ACTIVITY:  You should plan to take it easy for the rest of today and you should NOT DRIVE or  use heavy machinery until tomorrow (because of the sedation medicines used during the test).    FOLLOW UP: Our staff will call the number listed on your records the next business day following your procedure.  We will call around 7:15- 8:00 am to check on you and address any questions or concerns that you may have regarding the information given to you following your procedure. If we do not reach you, we will leave a message.     If any biopsies were taken you will be contacted by phone or by letter within the next 1-3 weeks.  Please call us at [phone] if you have not heard about the biopsies in 3 weeks.    SIGNATURES/CONFIDENTIALITY: You and/or your care partner have signed paperwork which will be entered into your electronic medical record.  These signatures attest to the fact that that the information above on your After Visit Summary has been reviewed and is understood.  Full responsibility of the confidentiality of this discharge information lies with you and/or your care-partner. 

## 2022-04-16 NOTE — Progress Notes (Signed)
Called to room to assist during endoscopic procedure.  Patient ID and intended procedure confirmed with present staff. Received instructions for my participation in the procedure from the performing physician.  

## 2022-04-17 ENCOUNTER — Telehealth: Payer: Self-pay | Admitting: *Deleted

## 2022-04-17 NOTE — Telephone Encounter (Signed)
No answer on  follow up call. Left message.   

## 2022-04-18 ENCOUNTER — Encounter: Payer: Self-pay | Admitting: Gastroenterology

## 2022-09-16 DIAGNOSIS — H524 Presbyopia: Secondary | ICD-10-CM | POA: Diagnosis not present

## 2023-03-23 DIAGNOSIS — Z6825 Body mass index (BMI) 25.0-25.9, adult: Secondary | ICD-10-CM | POA: Diagnosis not present

## 2023-03-23 DIAGNOSIS — Z1231 Encounter for screening mammogram for malignant neoplasm of breast: Secondary | ICD-10-CM | POA: Diagnosis not present

## 2023-03-23 DIAGNOSIS — Z01419 Encounter for gynecological examination (general) (routine) without abnormal findings: Secondary | ICD-10-CM | POA: Diagnosis not present

## 2024-04-04 ENCOUNTER — Other Ambulatory Visit: Payer: Self-pay | Admitting: Obstetrics and Gynecology

## 2024-04-04 DIAGNOSIS — R928 Other abnormal and inconclusive findings on diagnostic imaging of breast: Secondary | ICD-10-CM

## 2024-04-13 ENCOUNTER — Ambulatory Visit

## 2024-04-13 ENCOUNTER — Ambulatory Visit
Admission: RE | Admit: 2024-04-13 | Discharge: 2024-04-13 | Disposition: A | Discharge status: Home / Self Care | Source: Ambulatory Visit | Attending: Obstetrics and Gynecology | Admitting: Obstetrics and Gynecology

## 2024-04-13 DIAGNOSIS — R928 Other abnormal and inconclusive findings on diagnostic imaging of breast: Secondary | ICD-10-CM
